# Patient Record
Sex: Female | Born: 2000
Health system: Southern US, Community
[De-identification: ages and names within clinical notes are randomized; demographics above are authoritative.]

## PROBLEM LIST (undated history)

## (undated) DIAGNOSIS — J45909 Unspecified asthma, uncomplicated: Secondary | ICD-10-CM

## (undated) DIAGNOSIS — K219 Gastro-esophageal reflux disease without esophagitis: Secondary | ICD-10-CM

## (undated) DIAGNOSIS — J101 Influenza due to other identified influenza virus with other respiratory manifestations: Secondary | ICD-10-CM

## (undated) DIAGNOSIS — E282 Polycystic ovarian syndrome: Secondary | ICD-10-CM

## (undated) HISTORY — DX: Gastro-esophageal reflux disease without esophagitis: K21.9

## (undated) HISTORY — PX: TYMPANOSTOMY: SHX2586

---
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
## 2021-03-01 IMAGING — CT CT ABD-PELV W/ CM
2 of 4 series · 15 of 46 positions shown, 17 images · IV contrast (APPLIED)
Comparison: CT abdomen pelvis dated 07/12/2019.

CLINICAL DATA: 19-year-old female 19-year-old female with right
lower quadrant abdominal pain.

EXAM:
CT ABDOMEN AND PELVIS WITH CONTRAST
TECHNIQUE: Multidetector CT imaging of the abdomen and pelvis was performed
using the standard protocol following bolus administration of
intravenous contrast.
CONTRAST:  100mL OMNIPAQUE IOHEXOL 300 MG/ML  SOLN

[Series 2: axial st · axial · 0.68mm/px · z∈[+1018,+1433]mm · 12 of 95 slices shown, 14 images]
[im 6/95  soft-tissue]
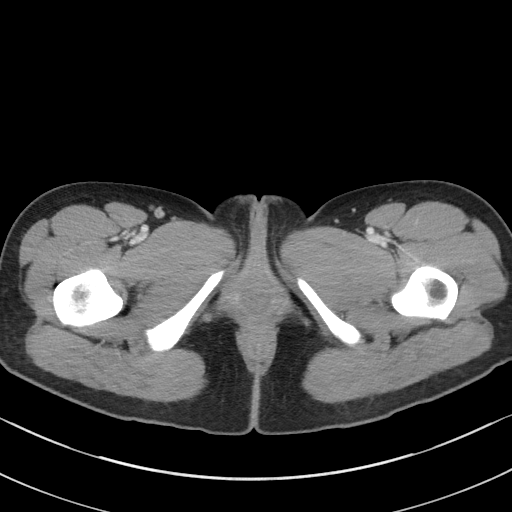
[im 6/95  bone]
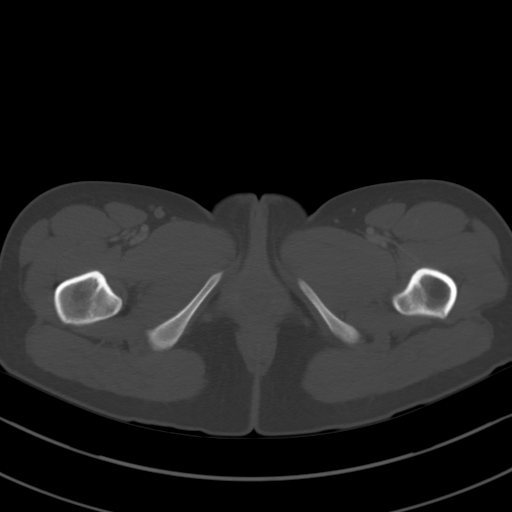
[im 16/95  soft-tissue]
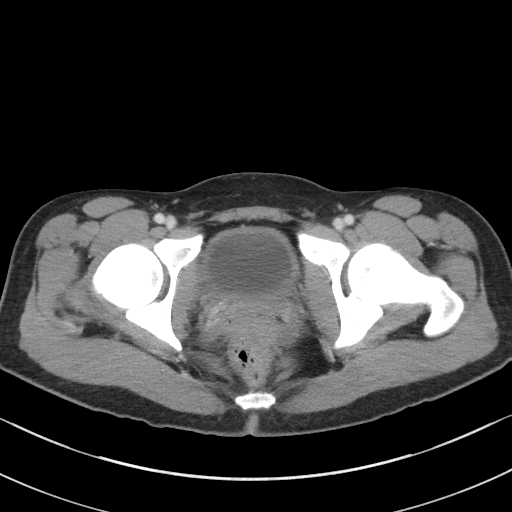
[im 21/95  soft-tissue]
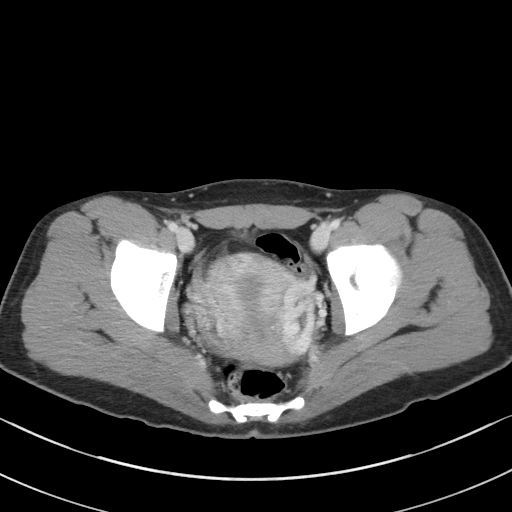
[im 27/95  soft-tissue]
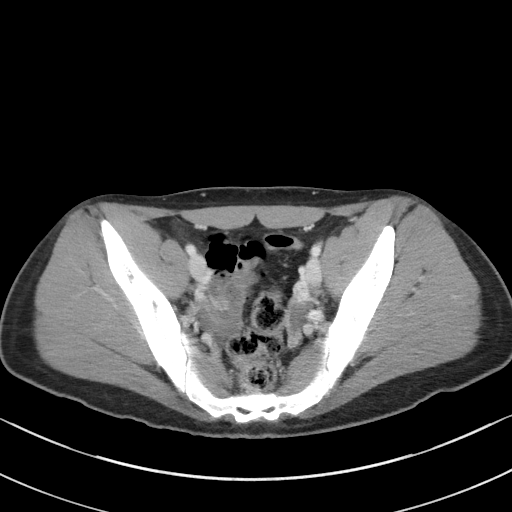
[im 37/95  soft-tissue]
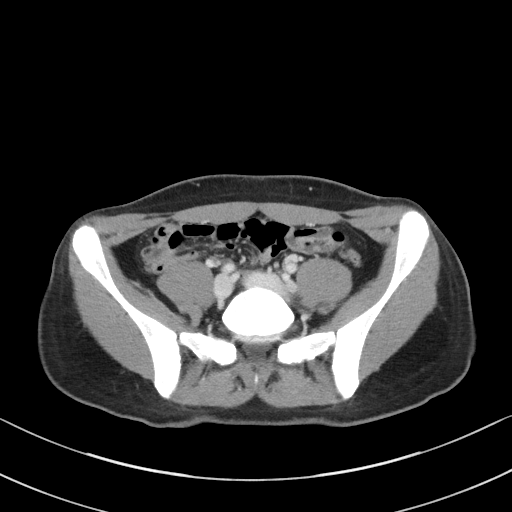
[im 42/95  soft-tissue]
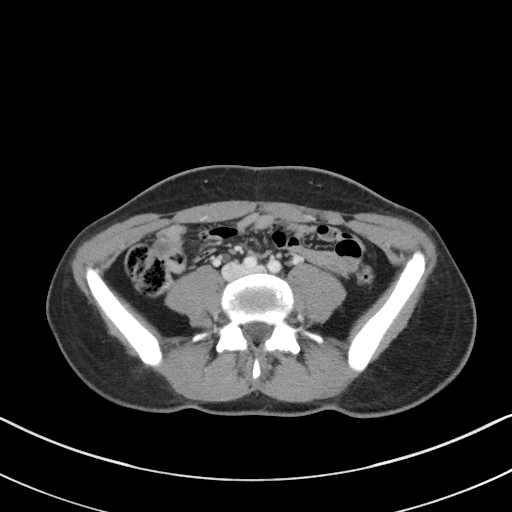
[im 53/95  soft-tissue]
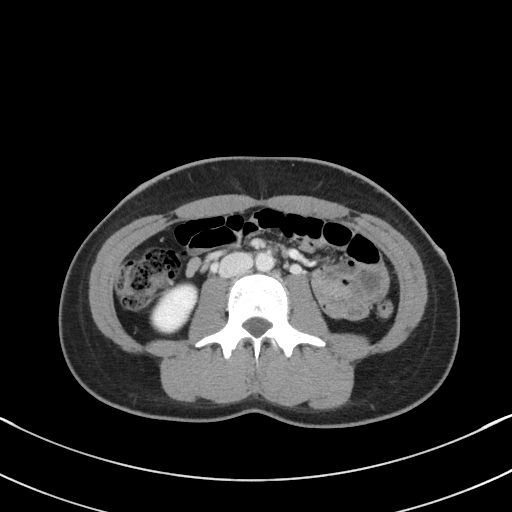
[im 58/95  soft-tissue]
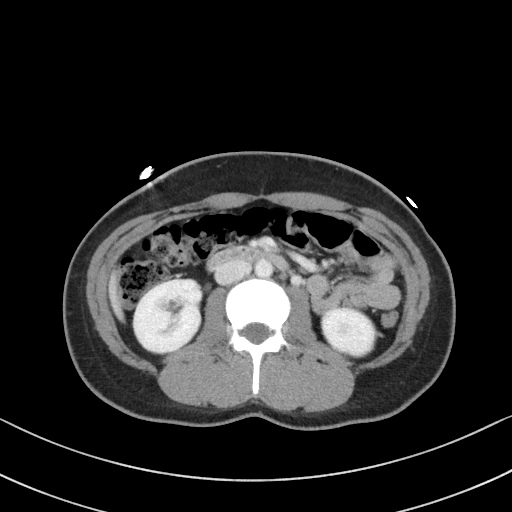
[im 68/95  soft-tissue]
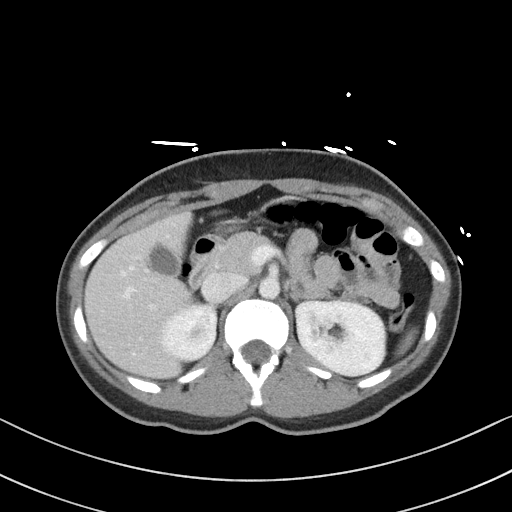
[im 68/95  bone]
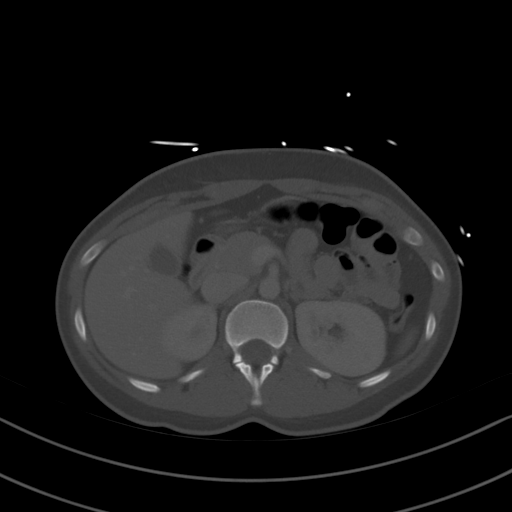
[im 74/95  soft-tissue]
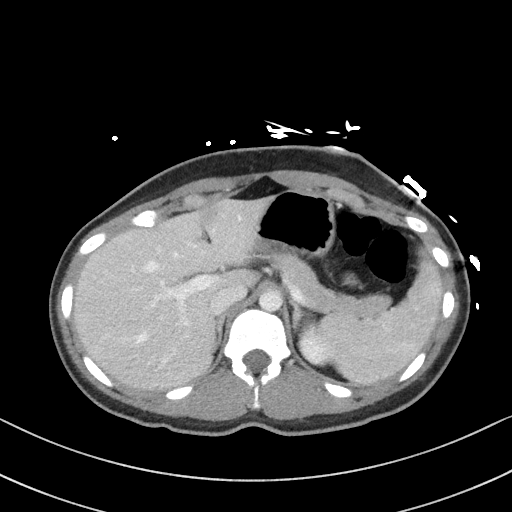
[im 79/95  soft-tissue]
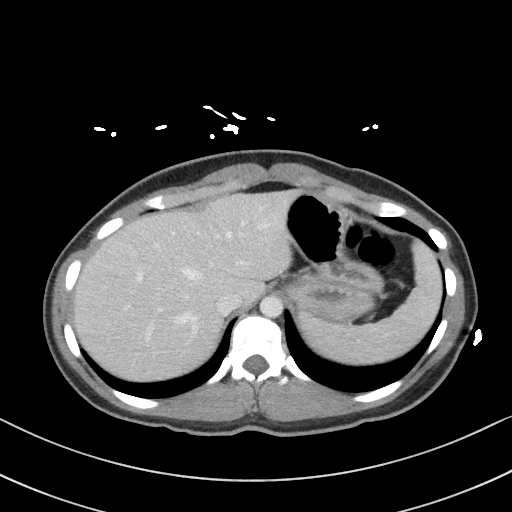
[im 89/95  soft-tissue]
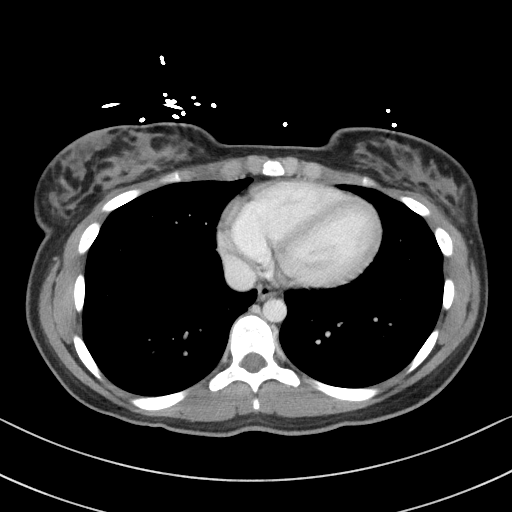

[Series 5: coronal st · coronal · 0.63mm/px · 3 of 70 slices shown]
[im 24/70  soft-tissue]
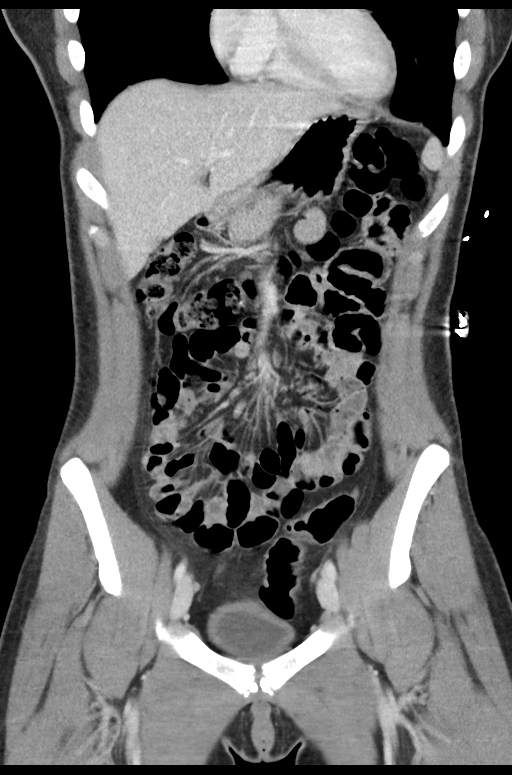
[im 31/70  soft-tissue]
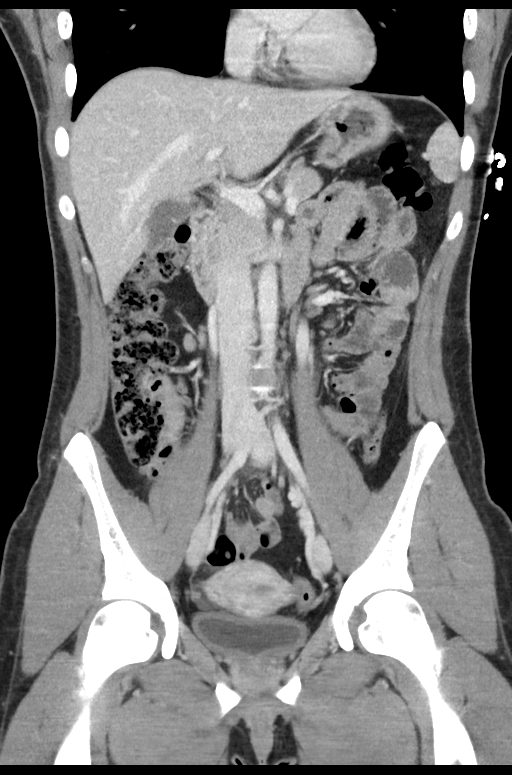
[im 39/70  soft-tissue]
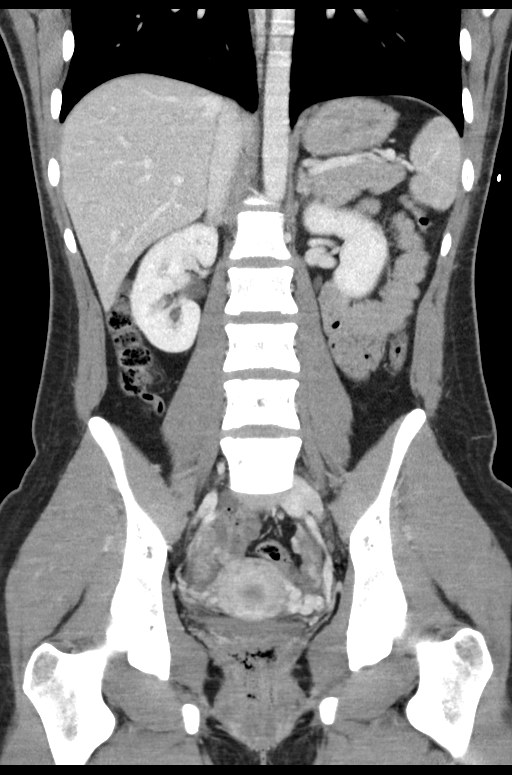

[15 of 46 positions shown; findings below may reference images not displayed]

FINDINGS: Lower chest: The visualized lung bases are clear.

No intra-abdominal free air. There is a small slightly higher
attenuating fluid within the pelvis, likely a serosanguineous or
hemorrhagic fluid.

Hepatobiliary: No focal liver abnormality is seen. No gallstones,
gallbladder wall thickening, or biliary dilatation.

Pancreas: Unremarkable. No pancreatic ductal dilatation or
surrounding inflammatory changes.

Spleen: Normal in size without focal abnormality.

Adrenals/Urinary Tract: Adrenal glands are unremarkable. Kidneys are
normal, without renal calculi, focal lesion, or hydronephrosis.
Bladder is unremarkable.

Stomach/Bowel: There is no bowel obstruction or active inflammation.
The appendix is normal.

Vascular/Lymphatic: The abdominal aorta and IVC unremarkable. No
portal venous gas. There is no adenopathy.

Reproductive: The uterus is anteverted and grossly unremarkable.
There is a 2 cm dominant follicle or corpus luteum in the right
ovary with possible rupture. The left ovary is unremarkable.

There is a somewhat linear contrast blush in the right hemipelvis
adjacent to the right ovary (bone series 5 images 32-49 and axial
series 2 images 70-72) likely a small venous bleed. There is slight
prominence of the gonadal veins within the pelvis.

Other: None

Musculoskeletal: No acute or significant osseous findings.
IMPRESSION: 1. Small amount of hemorrhagic fluid within the pelvis may be
related to rupture of a 2 cm right ovarian follicle or small venous
bleed.
2. No bowel obstruction. Normal appendix.

## 2021-03-21 ENCOUNTER — Encounter: Payer: Self-pay | Admitting: Neurology

## 2021-04-01 NOTE — Progress Notes (Signed)
[facility] Clinic Note - Initial Visit   Date: 04/04/21  Beverly Ward MRN: [id number] DOB: [birth year]   Dear Dr.Thompson:  Thank you for your kind referral of Beverly Ward for consultation of left hand numbness. Although her history is well known to you, please allow Beverly Ward to reiterate it for the purpose of our medical record. The patient was accompanied to the clinic by self.    History of Present Illness: Beverly Ward is a 21 y.o. right-handed female presenting for evaluation of left hand numbness. Starting around December, she began having spells of numbness/tingling of the left hand. It generally lasts about 2 hours and occurs 2-4 times times daily. She has noticed that it occurs more when she is holding onto something. No specific triggers or alleviating factors. No neck pain or weakness. No face/leg numbness or similar symptoms on the right.   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  She lives at home with her mother.   Past Medical History:  Diagnosis Date   Asthma    GERD (gastroesophageal reflux disease)    Influenza B     Past Surgical History:  Procedure Laterality Date   TYMPANOSTOMY Bilateral      Medications:  Outpatient Encounter Medications as of 04/04/2021  Medication Sig Note   [DISCONTINUED] albuterol (VENTOLIN HFA) 108 (90 Base) MCG/ACT inhaler Inhale 2 puffs into the lungs every 4 (four) hours as needed for wheezing or shortness of breath.    [DISCONTINUED] amitriptyline (ELAVIL) 25 MG tablet Take 1 tablet (25 mg total) by mouth at bedtime.    [DISCONTINUED] ipratropium (ATROVENT) 0.06 % nasal spray Place 2 sprays into both nostrils 4 (four) times daily. (Patient not taking: Reported on 07/09/2019)    [DISCONTINUED] ketoconazole (NIZORAL) 2 % cream Apply 1 application topically 2 (two) times daily.  (Patient not taking: Reported on 04/04/2021)    [DISCONTINUED] loratadine (CLARITIN) 10 MG tablet Take  by mouth. 10/16/2016: PRN   [DISCONTINUED] norethindrone (AYGESTIN) 5 MG tablet Take 1 tablet (5 mg total) by mouth daily for 10 days. Take 1 pill daily for 10 days at the beginning of each month (Patient not taking: Reported on 04/04/2021)    [DISCONTINUED] omeprazole (PRILOSEC) 20 MG capsule Take 1 capsule (20 mg total) by mouth 2 (two) times daily before a meal.    [DISCONTINUED] QUDEXY XR 25 MG CS24 sprinkle cap Take 1 at bedtime for 1 month then stop the medication    [DISCONTINUED] SUMAtriptan (IMITREX) 25 MG tablet Take 1 tablet at onset of migraine along with Ibuprofen 400mg . (Patient not taking: Reported on 04/04/2021) 08/28/2019: Pt needs refill.   No facility-administered encounter medications on file as of 04/04/2021.    Allergies:  Allergies  Allergen Reactions   Codeine Swelling   Augmentin [Amoxicillin-Pot Clavulanate]     Does not remember   Penicillins     Does not remember   Hepatitis A Virus Vaccine Inactivated Rash    macular rash started 2 hours after Hep A Vaccine    Family History: Family History  Problem Relation Age of Onset   Migraines Mother    Hypertension Mother    Hypertension Father    Asthma Sister    Asthma Brother    Diabetes Maternal Grandmother    Hypertension Maternal Grandmother    Diabetes Maternal Grandfather    Hypertension Maternal Grandfather    Migraines Maternal Grandfather    Seizures Cousin  Strong pfhx    Social History: Social History   Tobacco Use   Smoking status: Never   Smokeless tobacco: Never  Vaping Use   Vaping Use: Never used  Substance Use Topics   Alcohol use: No   Drug use: No   Social History   Social History Narrative   Beverly Ward is a rising 10th grade student.   She attends [employer].    Lives with mother and siblings.       Right Handed    Lives in a two story home            Vital Signs:  BP 122/83    Pulse (!) 101    Ht 5\' 3"  (1.6 m)    Wt 121 lb (54.9 kg)    SpO2 100%    BMI  21.43 kg/m   Neurological Exam: MENTAL STATUS including orientation to time, place, person, recent and remote memory, attention span and concentration, language, and fund of knowledge is normal.  Speech is not dysarthric.  CRANIAL NERVES: II:  No visual field defects.     III-IV-VI: Pupils equal round and reactive to light.  Normal conjugate, extra-ocular eye movements in all directions of gaze.  No nystagmus.  No ptosis.   V:  Normal facial sensation.    VII:  Normal facial symmetry and movements.   VIII:  Normal hearing and vestibular function.   IX-X:  Normal palatal movement.   XI:  Normal shoulder shrug and head rotation.   XII:  Normal tongue strength and range of motion, no deviation or fasciculation.  MOTOR:  Motor strength is 5/5 throughout.  No atrophy, fasciculations or abnormal movements.  No pronator drift.  MSRs:  Right        Left                  brachioradialis 2+  2+  biceps 2+  2+  triceps 2+  2+  patellar 2+  2+  ankle jerk 2+  2+  Hoffman no  no  plantar response down  down   SENSORY:  Normal and symmetric perception of light touch, pinprick, vibration, and proprioception  COORDINATION/GAIT: Normal finger-to- nose-finger.  Intact rapid alternating movements bilaterally.  Gait narrow based and stable. Tandem and stressed gait intact.    IMPRESSION: Left hand numbness, episodic. Ddx: entrapment neuropathy vs cervical radiculopathy. Symptoms do not follow a particular nerve distribution and exam is normal.  Additional testing with NCS/EMG of the left hand will be ordered to better localize her symptoms.  Further recommendations pending results.   Thank you for allowing me to participate in patient's care.  If I can answer any additional questions, I would be pleased to do so.    Sincerely,    Aizen Duval K. Posey Pronto, DO

## 2021-04-04 ENCOUNTER — Other Ambulatory Visit: Payer: Self-pay

## 2021-04-04 ENCOUNTER — Encounter: Payer: Self-pay | Admitting: Neurology

## 2021-04-04 ENCOUNTER — Ambulatory Visit (INDEPENDENT_AMBULATORY_CARE_PROVIDER_SITE_OTHER): Payer: No Typology Code available for payment source | Admitting: Neurology

## 2021-04-04 VITALS — BP 122/83 | HR 101 | Ht 63.0 in | Wt 121.0 lb

## 2021-04-04 DIAGNOSIS — R202 Paresthesia of skin: Secondary | ICD-10-CM | POA: Diagnosis not present

## 2021-04-04 NOTE — Patient Instructions (Signed)
Nerve testing of the left arm  ELECTROMYOGRAM AND NERVE CONDUCTION STUDIES (EMG/NCS) INSTRUCTIONS  How to Prepare The neurologist conducting the EMG will need to know if you have certain medical conditions. Tell the neurologist and other EMG lab personnel if you: Have a pacemaker or any other electrical medical device Take blood-thinning medications Have hemophilia, a blood-clotting disorder that causes prolonged bleeding Bathing Take a shower or bath shortly before your exam in order to remove oils from your skin. Don't apply lotions or creams before the exam.  What to Expect You'll likely be asked to change into a hospital gown for the procedure and lie down on an examination table. The following explanations can help you understand what will happen during the exam.  Electrodes. The neurologist or a technician places surface electrodes at various locations on your skin depending on where you're experiencing symptoms. Or the neurologist may insert needle electrodes at different sites depending on your symptoms.  Sensations. The electrodes will at times transmit a tiny electrical current that you may feel as a twinge or spasm. The needle electrode may cause discomfort or pain that usually ends shortly after the needle is removed. If you are concerned about discomfort or pain, you may want to talk to the neurologist about taking a short break during the exam.  Instructions. During the needle EMG, the neurologist will assess whether there is any spontaneous electrical activity when the muscle is at rest - activity that isn't present in healthy muscle tissue - and the degree of activity when you slightly contract the muscle.  He or she will give you instructions on resting and contracting a muscle at appropriate times. Depending on what muscles and nerves the neurologist is examining, he or she may ask you to change positions during the exam.  After your EMG You may experience some temporary, minor  bruising where the needle electrode was inserted into your muscle. This bruising should fade within several days. If it persists, contact your primary care doctor.   

## 2021-04-07 ENCOUNTER — Ambulatory Visit (INDEPENDENT_AMBULATORY_CARE_PROVIDER_SITE_OTHER): Payer: No Typology Code available for payment source | Admitting: Neurology

## 2021-04-07 ENCOUNTER — Encounter: Payer: Self-pay | Admitting: Neurology

## 2021-04-07 ENCOUNTER — Other Ambulatory Visit: Payer: Self-pay

## 2021-04-07 DIAGNOSIS — R202 Paresthesia of skin: Secondary | ICD-10-CM

## 2021-04-07 DIAGNOSIS — G5622 Lesion of ulnar nerve, left upper limb: Secondary | ICD-10-CM

## 2021-04-07 NOTE — Procedures (Signed)
[facility]  [address] [place], [address]  [place], [place] [address] Tel: [phone] Fax:  [phone] Test Date:  04/07/2021  Patient: Beverly Ward DOB: Aug 06, 2000 Physician: Nita Sickle, DO  Sex: Female Height: 5\' 3"  Ref Phys: , DO  ID#: Nita Sickle   Technician:    Patient Complaints: This is a 21 year old female referred for evaluation of left hand paresthesias  NCV & EMG Findings: Extensive electrodiagnostic testing of the left upper extremity shows:  Left median, ulnar, and mixed palmar sensory responses are within normal limits Left median motor responses within normal limits.  Left ulnar motor response shows slowed conduction velocity across the elbow (A Elbow-B Elbow, 48 m/s).   There is no evidence of active or chronic motor axonal loss changes affecting any of the tested muscles.  Motor unit configuration and recruitment pattern is within normal limits.    Impression: Left ulnar neuropathy with slowing across the elbow, purely demyelinating, mild. There is no evidence of left carpal tunnel syndrome or cervical radiculopathy.   ___________________________ 12, DO    Nerve Conduction Studies Anti Sensory Summary Table   Stim Site NR Peak (ms) Norm Peak (ms) P-T Amp (V) Norm P-T Amp  Left Median Anti Sensory (2nd Digit)  Wrist    2.5 <3.3 52.1 >20  Left Ulnar Anti Sensory (5th Digit)  34C  Wrist    2.3 <3.0 58.1 >18   Motor Summary Table   Stim Site NR Onset (ms) Norm Onset (ms) O-P Amp (mV) Norm O-P Amp Site1 Site2 Delta-0 (ms) Dist (cm) Vel (m/s) Norm Vel (m/s)  Left Median Motor (Abd Poll Brev)  34C  Wrist    3.0 <3.9 8.4 >6 Elbow Wrist 4.3 27.0 63 >51  Elbow    7.3  8.2         Left Ulnar Motor (Abd Dig Minimi)  34C  Wrist    2.5 <3.0 12.7 >8 B Elbow Wrist 2.8 20.0 71 >51  B Elbow    5.3  12.2  A Elbow B Elbow 2.1 10.0 48 >51  A Elbow    7.4  11.7          Comparison Summary Table   Stim Site NR Peak (ms) Norm Peak  (ms) P-T Amp (V) Site1 Site2 Delta-P (ms) Norm Delta (ms)  Left Median/Ulnar Palm Comparison (Wrist - 8cm)  34C  Median Palm    1.4 <2.2 133.8 Median Palm Ulnar Palm 0.1   Ulnar Palm    1.3 <2.2 45.0       EMG   Side Muscle Ins Act Fibs Psw Fasc Number Recrt Dur Dur. Amp Amp. Poly Poly. Comment  Left 1stDorInt Nml Nml Nml Nml Nml Nml Nml Nml Nml Nml Nml Nml N/A  Left PronatorTeres Nml Nml Nml Nml Nml Nml Nml Nml Nml Nml Nml Nml N/A  Left Biceps Nml Nml Nml Nml Nml Nml Nml Nml Nml Nml Nml Nml N/A  Left Triceps Nml Nml Nml Nml Nml Nml Nml Nml Nml Nml Nml Nml N/A  Left FlexCarpiUln Nml Nml Nml Nml Nml Nml Nml Nml Nml Nml Nml Nml N/A  Left Deltoid Nml Nml Nml Nml Nml Nml Nml Nml Nml Nml Nml Nml N/A      Waveforms:

## 2021-04-27 DIAGNOSIS — Z9049 Acquired absence of other specified parts of digestive tract: Secondary | ICD-10-CM | POA: Diagnosis not present

## 2021-04-27 DIAGNOSIS — R109 Unspecified abdominal pain: Secondary | ICD-10-CM | POA: Diagnosis not present

## 2021-04-27 DIAGNOSIS — S0990XA Unspecified injury of head, initial encounter: Secondary | ICD-10-CM | POA: Diagnosis not present

## 2021-04-27 DIAGNOSIS — S199XXA Unspecified injury of neck, initial encounter: Secondary | ICD-10-CM | POA: Diagnosis not present

## 2021-04-27 DIAGNOSIS — R1084 Generalized abdominal pain: Secondary | ICD-10-CM | POA: Diagnosis not present

## 2021-04-27 DIAGNOSIS — R079 Chest pain, unspecified: Secondary | ICD-10-CM | POA: Diagnosis not present

## 2021-04-27 DIAGNOSIS — R000 Tachycardia, unspecified: Secondary | ICD-10-CM | POA: Diagnosis not present

## 2021-05-23 ENCOUNTER — Ambulatory Visit: Payer: BC Managed Care – PPO | Admitting: Neurology

## 2021-06-25 ENCOUNTER — Encounter (HOSPITAL_COMMUNITY): Payer: Self-pay

## 2021-06-25 ENCOUNTER — Emergency Department (HOSPITAL_COMMUNITY)
Admission: EM | Admit: 2021-06-25 | Discharge: 2021-06-25 | Disposition: A | Discharge status: Home / Self Care | Payer: BC Managed Care – PPO | Attending: Emergency Medicine | Admitting: Emergency Medicine

## 2021-06-25 ENCOUNTER — Other Ambulatory Visit: Payer: Self-pay

## 2021-06-25 DIAGNOSIS — K2900 Acute gastritis without bleeding: Secondary | ICD-10-CM | POA: Diagnosis not present

## 2021-06-25 DIAGNOSIS — R000 Tachycardia, unspecified: Secondary | ICD-10-CM | POA: Diagnosis not present

## 2021-06-25 DIAGNOSIS — R8271 Bacteriuria: Secondary | ICD-10-CM | POA: Diagnosis not present

## 2021-06-25 DIAGNOSIS — R1084 Generalized abdominal pain: Secondary | ICD-10-CM | POA: Diagnosis not present

## 2021-06-25 DIAGNOSIS — R112 Nausea with vomiting, unspecified: Secondary | ICD-10-CM

## 2021-06-25 HISTORY — DX: Polycystic ovarian syndrome: E28.2

## 2021-06-25 LAB — URINALYSIS, ROUTINE W REFLEX MICROSCOPIC
Bilirubin Urine: NEGATIVE
Glucose, UA: NEGATIVE mg/dL
Hgb urine dipstick: NEGATIVE
Ketones, ur: NEGATIVE mg/dL
Nitrite: POSITIVE — AB
Protein, ur: NEGATIVE mg/dL
Specific Gravity, Urine: 1.012 (ref 1.005–1.030)
pH: 7 (ref 5.0–8.0)

## 2021-06-25 LAB — CBC WITH DIFFERENTIAL/PLATELET
Abs Immature Granulocytes: 0.05 10*3/uL (ref 0.00–0.07)
Basophils Absolute: 0.1 10*3/uL (ref 0.0–0.1)
Basophils Relative: 1 %
Eosinophils Absolute: 0.1 10*3/uL (ref 0.0–0.5)
Eosinophils Relative: 1 %
HCT: 40.1 % (ref 36.0–46.0)
Hemoglobin: 14.2 g/dL (ref 12.0–15.0)
Immature Granulocytes: 0 %
Lymphocytes Relative: 20 %
Lymphs Abs: 2.3 10*3/uL (ref 0.7–4.0)
MCH: 32.9 pg (ref 26.0–34.0)
MCHC: 35.4 g/dL (ref 30.0–36.0)
MCV: 92.8 fL (ref 80.0–100.0)
Monocytes Absolute: 1.2 10*3/uL — ABNORMAL HIGH (ref 0.1–1.0)
Monocytes Relative: 10 %
Neutro Abs: 8.3 10*3/uL — ABNORMAL HIGH (ref 1.7–7.7)
Neutrophils Relative %: 68 %
Platelets: 298 10*3/uL (ref 150–400)
RBC: 4.32 MIL/uL (ref 3.87–5.11)
RDW: 12 % (ref 11.5–15.5)
WBC: 12 10*3/uL — ABNORMAL HIGH (ref 4.0–10.5)
nRBC: 0 % (ref 0.0–0.2)

## 2021-06-25 LAB — HCG, SERUM, QUALITATIVE: Preg, Serum: NEGATIVE

## 2021-06-25 LAB — COMPREHENSIVE METABOLIC PANEL
ALT: 18 U/L (ref 0–44)
AST: 18 U/L (ref 15–41)
Albumin: 4.2 g/dL (ref 3.5–5.0)
Alkaline Phosphatase: 48 U/L (ref 38–126)
Anion gap: 6 (ref 5–15)
BUN: 13 mg/dL (ref 6–20)
CO2: 25 mmol/L (ref 22–32)
Calcium: 8.7 mg/dL — ABNORMAL LOW (ref 8.9–10.3)
Chloride: 107 mmol/L (ref 98–111)
Creatinine, Ser: 0.73 mg/dL (ref 0.44–1.00)
GFR, Estimated: >60 mL/min (ref >60)
Glucose, Bld: 104 mg/dL — ABNORMAL HIGH (ref 70–99)
Potassium: 3.5 mmol/L (ref 3.5–5.1)
Sodium: 138 mmol/L (ref 135–145)
Total Bilirubin: 0.4 mg/dL (ref 0.3–1.2)
Total Protein: 7.2 g/dL (ref 6.5–8.1)

## 2021-06-25 LAB — LIPASE, BLOOD: Lipase: 31 U/L (ref 11–51)

## 2021-06-25 MED ORDER — PANTOPRAZOLE SODIUM 20 MG PO TBEC: 20.0000 mg | DELAYED_RELEASE_TABLET | Freq: Every day | ORAL | 0 refills | Status: DC

## 2021-06-25 MED ORDER — ALUM & MAG HYDROXIDE-SIMETH 200-200-20 MG/5ML PO SUSP
30.0000 mL | Freq: Once | ORAL | Status: AC
  Administered 2021-06-25: 30 mL via ORAL
  Filled 2021-06-25: qty 30

## 2021-06-25 MED ORDER — LIDOCAINE VISCOUS HCL 2 % MT SOLN
15.0000 mL | Freq: Once | OROMUCOSAL | Status: AC
  Administered 2021-06-25: 15 mL via ORAL
  Filled 2021-06-25: qty 15

## 2021-06-25 MED ORDER — ONDANSETRON HCL 4 MG/2ML IJ SOLN
4.0000 mg | Freq: Once | INTRAMUSCULAR | Status: AC
Start: 2021-06-25 — End: 2021-06-25
  Administered 2021-06-25: 4 mg via INTRAVENOUS
  Filled 2021-06-25: qty 2

## 2021-06-25 MED ORDER — LACTATED RINGERS IV BOLUS
1000.0000 mL | Freq: Once | INTRAVENOUS | Status: AC
  Administered 2021-06-25: 1000 mL via INTRAVENOUS

## 2021-06-25 MED ORDER — PANTOPRAZOLE SODIUM 40 MG IV SOLR
40.0000 mg | Freq: Once | INTRAVENOUS | Status: AC
Start: 2021-06-25 — End: 2021-06-25
  Administered 2021-06-25: 40 mg via INTRAVENOUS
  Filled 2021-06-25: qty 10

## 2021-06-25 MED ORDER — CEFPODOXIME PROXETIL 200 MG PO TABS: 200.0000 mg | ORAL_TABLET | Freq: Two times a day (BID) | ORAL | 0 refills | Status: AC

## 2021-06-25 MED ORDER — SODIUM CHLORIDE 0.9 % IV SOLN
2.0000 g | Freq: Once | INTRAVENOUS | Status: AC
  Administered 2021-06-25: 2 g via INTRAVENOUS
  Filled 2021-06-25: qty 20

## 2021-06-25 MED ORDER — ONDANSETRON 4 MG PO TBDP: ORAL_TABLET | ORAL | 0 refills | Status: DC

## 2021-06-25 NOTE — ED Triage Notes (Signed)
Pt brought in by EMS form home with c/o LUQ abd pain, N/V that started a couple of hours ago. Pt says she vomited 3 times. ?

## 2021-06-25 NOTE — ED Provider Notes (Signed)
?[facility] ?Provider Note ? ? ?CSN: [id number] ?Arrival date & time: 06/25/21  0212 ? ?  ? ?History ? ?Chief Complaint  ?Patient presents with  ? Abdominal Pain  ?  N/V  ? ? ?Beverly Ward is a 21 y.o. female. ? ? ?Abdominal Pain ?Pain location:  Generalized ?Pain radiates to:  Does not radiate ?Pain severity:  Mild ?Onset quality:  Gradual ?Timing:  Constant ? ?  ? ?Home Medications ?Prior to Admission medications   ?Medication Sig Start Date End Date Taking? Authorizing Provider  ?cefpodoxime (VANTIN) 200 MG tablet Take 1 tablet (200 mg total) by mouth 2 (two) times daily for 5 days. 06/25/21 06/30/21 Yes Fenix Ruppe, Barbara Cower, MD  ?cyclobenzaprine (FLEXERIL) 10 MG tablet Take by mouth. 04/27/21  Yes [provider]  ?ondansetron (ZOFRAN-ODT) 4 MG disintegrating tablet 4mg  ODT q4 hours prn nausea/vomit 06/25/21  Yes Kimberlynn Lumbra, 06/27/21, MD  ?pantoprazole (PROTONIX) 20 MG tablet Take 1 tablet (20 mg total) by mouth daily. 06/25/21  Yes Docie Abramovich, 06/27/21, MD  ?albuterol (VENTOLIN HFA) 108 (90 Base) MCG/ACT inhaler Inhale 2 puffs into the lungs every 4 (four) hours as needed for wheezing or shortness of breath. 02/03/19 08/13/19  10/13/19, MD  ?amitriptyline (ELAVIL) 25 MG tablet Take 1 tablet (25 mg total) by mouth at bedtime. 08/22/16 02/03/19  02/05/19, NP  ?ipratropium (ATROVENT) 0.06 % nasal spray Place 2 sprays into both nostrils 4 (four) times daily. ?Patient not taking: Reported on 07/09/2019 10/07/17 08/13/19  10/13/19 B, NP  ?loratadine (CLARITIN) 10 MG tablet Take by mouth.  02/03/19  [provider]  ?omeprazole (PRILOSEC) 20 MG capsule Take 1 capsule (20 mg total) by mouth 2 (two) times daily before a meal. 07/12/19 08/13/19  Garrick Midgley, 10/13/19, MD  ?QUDEXY XR 25 MG CS24 sprinkle cap Take 1 at bedtime for 1 month then stop the medication 10/16/16 02/03/19  02/05/19, NP  ?   ? ?Allergies    ?Codeine, Augmentin [amoxicillin-pot clavulanate], Penicillins, and Hepatitis a virus  vaccine inactivated   ? ?Review of Systems   ?Review of Systems  ?Gastrointestinal:  Positive for abdominal pain.  ? ?Physical Exam ?Updated Vital Signs ?BP 137/88   Pulse (!) 114   Temp 98.2 ?F (36.8 ?C) (Oral)   Resp 16   Ht 5\' 4"  (1.626 m)   Wt 58.5 kg   SpO2 100%   BMI 22.14 kg/m?  ?Physical Exam ?Vitals and nursing note reviewed.  ?Constitutional:   ?   Appearance: She is well-developed.  ?HENT:  ?   Head: Normocephalic and atraumatic.  ?Cardiovascular:  ?   Rate and Rhythm: Regular rhythm. Tachycardia present.  ?Pulmonary:  ?   Effort: Pulmonary effort is normal. No respiratory distress.  ?   Breath sounds: No stridor.  ?Abdominal:  ?   General: Bowel sounds are normal. There is no distension.  ?   Palpations: Abdomen is soft.  ?   Tenderness: There is no abdominal tenderness.  ?Musculoskeletal:  ?   Cervical back: Normal range of motion.  ?Skin: ?   General: Skin is warm and dry.  ?Neurological:  ?   General: No focal deficit present.  ?   Mental Status: She is alert.  ? ? ?ED Results / Procedures / Treatments   ?Labs ?(all labs ordered are listed, but only abnormal results are displayed) ?Labs Reviewed  ?CBC WITH DIFFERENTIAL/PLATELET - Abnormal; Notable for the following components:  ?    Result Value  ?  WBC 12.0 (*)   ? Neutro Abs 8.3 (*)   ? Monocytes Absolute 1.2 (*)   ? All other components within normal limits  ?COMPREHENSIVE METABOLIC PANEL - Abnormal; Notable for the following components:  ? Glucose, Bld 104 (*)   ? Calcium 8.7 (*)   ? All other components within normal limits  ?URINALYSIS, ROUTINE W REFLEX MICROSCOPIC - Abnormal; Notable for the following components:  ? APPearance CLOUDY (*)   ? Nitrite POSITIVE (*)   ? Leukocytes,Ua SMALL (*)   ? Bacteria, UA MANY (*)   ? All other components within normal limits  ?URINE CULTURE  ?LIPASE, BLOOD  ?HCG, SERUM, QUALITATIVE  ? ? ?EKG ?None ? ?Radiology ?No results found. ? ?Procedures ?Procedures  ? ? ?Medications Ordered in ED ?Medications   ?ondansetron (ZOFRAN) injection 4 mg (4 mg Intravenous Given 06/25/21 0304)  ?lactated ringers bolus 1,000 mL (0 mLs Intravenous Stopped 06/25/21 0401)  ?alum & mag hydroxide-simeth (MAALOX/MYLANTA) 200-200-20 MG/5ML suspension 30 mL (30 mLs Oral Given 06/25/21 0306)  ?  And  ?lidocaine (XYLOCAINE) 2 % viscous mouth solution 15 mL (15 mLs Oral Given 06/25/21 0306)  ?pantoprazole (PROTONIX) injection 40 mg (40 mg Intravenous Given 06/25/21 0304)  ?cefTRIAXone (ROCEPHIN) 2 g in sodium chloride 0.9 % 100 mL IVPB (0 g Intravenous Stopped 06/25/21 0534)  ? ? ?ED Course/ Medical Decision Making/ A&P ?  ?                        ?Medical Decision Making ?Amount and/or Complexity of Data Reviewed ?Labs: ordered. ? ?Risk ?OTC drugs. ?Prescription drug management. ? ? ?Suspect gastritis/esophagitis. Treated for same. Also found to have likely UTI. Been treated for same multiple times in the past, however does wish to get treated again. Will FU w/ urology.  ? ? ?Final Clinical Impression(s) / ED Diagnoses ?Final diagnoses:  ?Nausea and vomiting, unspecified vomiting type  ?Acute gastritis without hemorrhage, unspecified gastritis type  ?Bacteriuria  ? ? ?Rx / DC Orders ?ED Discharge Orders   ? ?      Ordered  ?  ondansetron (ZOFRAN-ODT) 4 MG disintegrating tablet       ? 06/25/21 0520  ?  pantoprazole (PROTONIX) 20 MG tablet  Daily       ? 06/25/21 0520  ?  cefpodoxime (VANTIN) 200 MG tablet  2 times daily       ? 06/25/21 0520  ? ?  ?  ? ?  ? ? ?  ?Marily Memos, MD ?06/27/21 3716 ? ?

## 2021-06-28 LAB — URINE CULTURE: Culture: >=100000 — AB

## 2021-06-29 ENCOUNTER — Telehealth: Payer: Self-pay

## 2021-06-29 NOTE — Telephone Encounter (Signed)
Post ED Visit - Positive Culture Follow-up ? ?Culture report reviewed by antimicrobial stewardship pharmacist: ?[facility] ?[]  , Enzo Bi.D. ?[]  [address], Pharm.D., BCPS AQ-ID ?[]  , Pharm.D., BCPS ?[]  Celedonio Miyamoto, .D., BCPS ?[]  [place], .D., BCPS, AAHIVP ?[]  Georgina Pillion, Pharm.D., BCPS, AAHIVP ?[x]  [address], PharmD, BCPS ?[]  , PharmD, BCPS ?[]  [place], PharmD, BCPS ?[]  [address], PharmD ?[]  , PharmD, BCPS ?[]  Estella Husk, PharmD ? ? [facility] ?[]  Lysle Pearl, PharmD ?[]  , PharmD ?[]  Phillips Climes, PharmD ?[]  , Rph ?[]  Agapito Games) , PharmD ?[]  Verlan Friends, PharmD ?[]  , PharmD ?[]  Mervyn Gay, PharmD ?[]  , PharmD ?[]  Vinnie Level, PharmD ?[]  Gerri Spore, PharmD ?[]  , PharmD ?[]  Len Childs, PharmD ? ? ?Positive urine culture ?Treated with Cefpodoxime Proxetil, organism sensitive to the same and no further patient follow-up is required at this time. ? ? ?06/29/2021, 9:56 AM ?  ?

## 2021-07-25 ENCOUNTER — Emergency Department (HOSPITAL_COMMUNITY): Payer: BC Managed Care – PPO

## 2021-07-25 ENCOUNTER — Telehealth: Payer: Self-pay | Admitting: Neurology

## 2021-07-25 ENCOUNTER — Other Ambulatory Visit: Payer: Self-pay

## 2021-07-25 ENCOUNTER — Emergency Department (HOSPITAL_COMMUNITY)
Admission: EM | Admit: 2021-07-25 | Discharge: 2021-07-26 | Disposition: A | Discharge status: Home / Self Care | Payer: BC Managed Care – PPO | Attending: Emergency Medicine | Admitting: Emergency Medicine

## 2021-07-25 ENCOUNTER — Encounter (HOSPITAL_COMMUNITY): Payer: Self-pay

## 2021-07-25 DIAGNOSIS — R202 Paresthesia of skin: Secondary | ICD-10-CM | POA: Diagnosis not present

## 2021-07-25 DIAGNOSIS — D72829 Elevated white blood cell count, unspecified: Secondary | ICD-10-CM | POA: Diagnosis not present

## 2021-07-25 DIAGNOSIS — I619 Nontraumatic intracerebral hemorrhage, unspecified: Secondary | ICD-10-CM | POA: Diagnosis not present

## 2021-07-25 DIAGNOSIS — G1220 Motor neuron disease, unspecified: Secondary | ICD-10-CM | POA: Diagnosis not present

## 2021-07-25 DIAGNOSIS — J45909 Unspecified asthma, uncomplicated: Secondary | ICD-10-CM | POA: Insufficient documentation

## 2021-07-25 LAB — CBC WITH DIFFERENTIAL/PLATELET
Abs Immature Granulocytes: 0.06 10*3/uL (ref 0.00–0.07)
Basophils Absolute: 0.1 10*3/uL (ref 0.0–0.1)
Basophils Relative: 0 %
Eosinophils Absolute: 0 10*3/uL (ref 0.0–0.5)
Eosinophils Relative: 0 %
HCT: 43.1 % (ref 36.0–46.0)
Hemoglobin: 14.3 g/dL (ref 12.0–15.0)
Immature Granulocytes: 0 %
Lymphocytes Relative: 15 %
Lymphs Abs: 2.2 10*3/uL (ref 0.7–4.0)
MCH: 31.7 pg (ref 26.0–34.0)
MCHC: 33.2 g/dL (ref 30.0–36.0)
MCV: 95.6 fL (ref 80.0–100.0)
Monocytes Absolute: 0.8 10*3/uL (ref 0.1–1.0)
Monocytes Relative: 6 %
Neutro Abs: 11.1 10*3/uL — ABNORMAL HIGH (ref 1.7–7.7)
Neutrophils Relative %: 79 %
Platelets: 288 10*3/uL (ref 150–400)
RBC: 4.51 MIL/uL (ref 3.87–5.11)
RDW: 11.9 % (ref 11.5–15.5)
WBC: 14.3 10*3/uL — ABNORMAL HIGH (ref 4.0–10.5)
nRBC: 0 % (ref 0.0–0.2)

## 2021-07-25 LAB — COMPREHENSIVE METABOLIC PANEL
ALT: 12 U/L (ref 0–44)
AST: 18 U/L (ref 15–41)
Albumin: 4.4 g/dL (ref 3.5–5.0)
Alkaline Phosphatase: 45 U/L (ref 38–126)
Anion gap: 11 (ref 5–15)
BUN: 12 mg/dL (ref 6–20)
CO2: 21 mmol/L — ABNORMAL LOW (ref 22–32)
Calcium: 9 mg/dL (ref 8.9–10.3)
Chloride: 104 mmol/L (ref 98–111)
Creatinine, Ser: 0.72 mg/dL (ref 0.44–1.00)
GFR, Estimated: >60 mL/min (ref >60)
Glucose, Bld: 81 mg/dL (ref 70–99)
Potassium: 3.6 mmol/L (ref 3.5–5.1)
Sodium: 136 mmol/L (ref 135–145)
Total Bilirubin: 0.8 mg/dL (ref 0.3–1.2)
Total Protein: 7.2 g/dL (ref 6.5–8.1)

## 2021-07-25 LAB — I-STAT BETA HCG BLOOD, ED (MC, WL, AP ONLY): I-stat hCG, quantitative: <5 m[IU]/mL (ref <5)

## 2021-07-25 LAB — PHOSPHORUS: Phosphorus: 3.6 mg/dL (ref 2.5–4.6)

## 2021-07-25 LAB — MAGNESIUM: Magnesium: 2 mg/dL (ref 1.7–2.4)

## 2021-07-25 MED ORDER — GADOBUTROL 1 MMOL/ML IV SOLN
5.5000 mL | Freq: Once | INTRAVENOUS | Status: AC | PRN
  Administered 2021-07-25: 5.5 mL via INTRAVENOUS

## 2021-07-25 NOTE — ED Provider Triage Note (Signed)
Emergency Medicine Provider Triage Evaluation Note ? ?Beverly Ward , a 21 y.o. female  was evaluated in triage.  Pt complains of paresthesias to her right eye.  Symptoms have been going on for 3 days.  Cannot recall any incident that may have triggered this.  Denies any pain or symptoms in other parts of her body denies any sinus pain or pressure. ? ?Review of Systems  ?Positive: Facial paresthesias ?Negative: Pain ? ?Physical Exam  ?BP (!) 156/98   Pulse (!) 120   Temp 99.6 ?F (37.6 ?C) (Oral)   Resp 16   SpO2 100%  ?Gen:   Awake, no distress   ?Resp:  Normal effort  ?MSK:   Moves extremities without difficulty  ?Other:  No external abnormalities seen in this area ? ?Medical Decision Making  ?Medically screening exam initiated at 4:56 PM.  Appropriate orders placed.  Beverly Ward was informed that the remainder of the evaluation will be completed by another provider, this initial triage assessment does not replace that evaluation, and the importance of remaining in the ED until their evaluation is complete. ? ? ?  Dietrich Pates, PA-C ?07/25/21 1657 ? ?
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
## 2021-07-25 NOTE — ED Notes (Signed)
Pt off unit to MRI.

## 2021-07-25 NOTE — ED Triage Notes (Signed)
Patient complains of tingling of bridge of nose to tingling under right eye, denies trauma. Alert and oriented, NAD ?

## 2021-07-25 NOTE — ED Provider Notes (Signed)
[facility]   CSN: [id number] Arrival date & time: 07/25/21  1536     History  No chief complaint on file.   Beverly LinesCiera A Ward is a 21 y.o. female.  The history is provided by the patient and medical records. No language interpreter was used.   21 year old female significant history of generalized anxiety disorder, migraine, asthma, PCOS, who presents complaining of tingling sensation.  Patient report for the past 3 days she has been experiencing a tingling sensation to the bridge of her nose and right below right eye.  It has been a persistent symptoms nothing seems to make it better or worse.  No associated headache, vision changes, facial pain, congestion, fever neck stiffness or rash.  No hearing changes.  No recent injury.  No specific treatment tried.  Home Medications Prior to Admission medications   Medication Sig Start Date End Date Taking? Authorizing Provider  cyclobenzaprine (FLEXERIL) 10 MG tablet Take by mouth. 04/27/21   [provider]  ondansetron (ZOFRAN-ODT) 4 MG disintegrating tablet 4mg  ODT q4 hours prn nausea/vomit 06/25/21   Mesner, Barbara CowerJason, MD  pantoprazole (PROTONIX) 20 MG tablet Take 1 tablet (20 mg total) by mouth daily. 06/25/21   Mesner, Barbara CowerJason, MD  albuterol (VENTOLIN HFA) 108 (90 Base) MCG/ACT inhaler Inhale 2 puffs into the lungs every 4 (four) hours as needed for wheezing or shortness of breath. 02/03/19 08/13/19  Ree Shayeis, Jamie, MD  amitriptyline (ELAVIL) 25 MG tablet Take 1 tablet (25 mg total) by mouth at bedtime. 08/22/16 02/03/19  Elveria RisingGoodpasture, Tina, NP  ipratropium (ATROVENT) 0.06 % nasal spray Place 2 sprays into both nostrils 4 (four) times daily. Patient not taking: Reported on 07/09/2019 10/07/17 08/13/19  Linus MakoBurky, Natalie B, NP  loratadine (CLARITIN) 10 MG tablet Take by mouth.  02/03/19  [provider]  omeprazole (PRILOSEC) 20 MG capsule Take 1 capsule (20 mg total) by mouth 2 (two) times  daily before a meal. 07/12/19 08/13/19  Mesner, Barbara CowerJason, MD  QUDEXY XR 25 MG CS24 sprinkle cap Take 1 at bedtime for 1 month then stop the medication 10/16/16 02/03/19  Elveria RisingGoodpasture, Tina, NP      Allergies    Codeine, Augmentin [amoxicillin-pot clavulanate], Penicillins, and Hepatitis a virus vaccine inactivated    Review of Systems   Review of Systems  All other systems reviewed and are negative.  Physical Exam Updated Vital Signs BP (!) 156/98   Pulse (!) 120   Temp 99.6 F (37.6 C) (Oral)   Resp 16   SpO2 100%  Physical Exam Vitals and nursing note reviewed.  Constitutional:      General: She is not in acute distress.    Appearance: She is well-developed.  HENT:     Head: Atraumatic.     Right Ear: Tympanic membrane normal.     Left Ear: Tympanic membrane normal.     Mouth/Throat:     Mouth: Mucous membranes are moist.  Eyes:     Extraocular Movements: Extraocular movements intact.     Conjunctiva/sclera: Conjunctivae normal.     Pupils: Pupils are equal, round, and reactive to light.  Cardiovascular:     Rate and Rhythm: Normal rate and regular rhythm.  Pulmonary:     Effort: Pulmonary effort is normal.  Abdominal:     Palpations: Abdomen is soft.     Tenderness: There is no abdominal tenderness.  Musculoskeletal:     Cervical back: Neck supple.  Skin:    Findings: No  rash.  Neurological:     Mental Status: She is alert and oriented to person, place, and time.     Comments: Neurologic exam:  Speech clear, pupils equal round reactive to light, extraocular movements intact  Normal peripheral visual fields Cranial nerves III through XII normal including no facial droop Follows commands, moves all extremities x4, normal strength to bilateral upper and lower extremities at all major muscle groups including grip Sensation decreased to the bridge of nose and right zygomatic arch without any overlying skin changes.  Sensation is intact to bilateral arms and legs Coordination  intact, no limb ataxia, finger-nose-finger normal Rapid alternating movements normal No pronator drift Gait normal   Psychiatric:        Mood and Affect: Mood normal.    ED Results / Procedures / Treatments   Labs (all labs ordered are listed, but only abnormal results are displayed) Labs Reviewed  CBC WITH DIFFERENTIAL/PLATELET - Abnormal; Notable for the following components:      Result Value   WBC 14.3 (*)    Neutro Abs 11.1 (*)    All other components within normal limits  COMPREHENSIVE METABOLIC PANEL - Abnormal; Notable for the following components:   CO2 21 (*)    All other components within normal limits  MAGNESIUM  PHOSPHORUS  I-STAT BETA HCG BLOOD, ED ([facility], [facility], [facility] ONLY)    EKG None  Radiology MR Brain W and Wo Contrast  Result Date: 07/25/2021 CLINICAL DATA:  Motor neuron disease EXAM: MRI HEAD WITHOUT AND WITH CONTRAST TECHNIQUE: Multiplanar, multiecho pulse sequences of the brain and surrounding structures were obtained without and with intravenous contrast. CONTRAST:  5.7mL GADAVIST GADOBUTROL 1 MMOL/ML IV SOLN COMPARISON:  None Available. FINDINGS: Brain: No acute infarct, mass effect or extra-axial collection. No acute or chronic hemorrhage. Normal white matter signal, parenchymal volume and CSF spaces. The midline structures are normal. Vascular: Major flow voids are preserved. Skull and upper cervical spine: Normal calvarium and skull base. Visualized upper cervical spine and soft tissues are normal. Sinuses/Orbits:No paranasal sinus fluid levels or advanced mucosal thickening. No mastoid or middle ear effusion. Normal orbits. IMPRESSION: Normal brain MRI. Electronically Signed   By: Deatra Robinson M.D.   On: 07/25/2021 23:52    Procedures Procedures    Medications Ordered in ED Medications  gadobutrol (GADAVIST) 1 MMOL/ML injection 5.5 mL (5.5 mLs Intravenous Contrast Given 07/25/21 2316)    ED Course/ Medical Decision Making/ A&P                            Medical Decision Making Amount and/or Complexity of Data Reviewed Labs: ordered. Radiology: ordered.  Risk Prescription drug management.   BP 122/82   Pulse (!) 112   Temp 99.6 F (37.6 C) (Oral)   Resp 18   SpO2 100%   7:40 PM This is a 21 year old female who presents with complaints of tingling sensation to the bridge of her nose and underneath right eye ongoing for the past 3 days.  She denies any associated headache, congestion, facial pain, trouble breathing, blurry vision, change in vision or rash.  She denies any recent trauma.  She does have some subjective decrease sensation to the bridge of her nose and to her right zygomatic arch but no overlying skin changes.  Patient overall well-appearing appears to be in no acute discomfort.  No other focal neurodeficit appreciated.  Chart review shows patient has had several neurology visits by  [facility] for various complaints and was diagnosed with postconcussive syndromes as well as migraine with aura.  I appreciate consultation from on-call neurologist, Dr. Jerrell Belfast, who recommended brain MRI with and without contrast to rule out conditions such as MS.  7:42 PM  12:06 AM Labs and imaging obtained and independently reviewed and interpreted by me.  Mild elevated white count of 14.3, nonspecific but patient without infectious symptoms.  Her pregnancy test is negative, electrolyte panels are reassuring, an MRI of the brain with and without contrast without any acute finding.  Specifically no evidence of MS.  I discussed finding with patient, recommend outpatient follow-up with her neurologist for outpatient evaluation.   This patient presents to the ED for concern of tingling sensation, this involves an extensive number of treatment options, and is a complaint that carries with it a high risk of complications and morbidity.  The differential diagnosis includes MS, electrolytes imbalance, complicated migraine, herpes ophalmacus   Co  morbidities that complicate the patient evaluation hx of migraine  Anxiety  PTSD Additional history obtained:  Additional history obtained from pt External records from outside source obtained and reviewed including notes from her neurologist DR. Patel  Lab Tests:  I Ordered, and personally interpreted labs.  The pertinent results include:  as above  Imaging Studies ordered:  I ordered imaging studies including brain MRI I independently visualized and interpreted imaging which showed no acute finding I agree with the radiologist interpretation  Cardiac Monitoring:  The patient was maintained on a cardiac monitor.  I personally viewed and interpreted the cardiac monitored which showed an underlying rhythm of: sinus tachycardia  Medicines ordered and prescription drug management:   Test Considered: as above  Critical Interventions: none  Consultations Obtained:  I requested consultation with the neurologist DR. Aurora,  and discussed lab and imaging findings as well as pertinent plan - they recommend: brain MRI, if negative pt can f/u with her neurologist  Problem List / ED Course: facial tingling sensation  Reevaluation:  After the interventions noted above, I reevaluated the patient and found that they have :improved  Social Determinants of Health:   Dispostion:  After consideration of the diagnostic results and the patients response to treatment, I feel that the patent would benefit from outpt f/u.         Final Clinical Impression(s) / ED Diagnoses Final diagnoses:  Paresthesia    Rx / DC Orders ED Discharge Orders     None         Fayrene Helper, PA-C 07/26/21 0011    Gloris Manchester, MD 07/29/21 [phone]

## 2021-07-26 NOTE — Discharge Instructions (Signed)
You have been evaluated for your symptoms.  Fortunately your brain MRI did not show any concerning findings, specifically no evidence to suggest multiple sclerosis.  Please follow-up closely with your neurologist for further evaluation.  Return if your symptoms worsen or if you have any other concern. ?

## 2021-07-26 NOTE — ED Notes (Signed)
Patient verbalizes understanding of d/c instructions. Opportunities for questions and answers were provided. Pt d/c from ED and ambulated to lobby.  

## 2021-08-01 NOTE — Telephone Encounter (Signed)
Patient went to ER and had normal brain MRI. F/u when Dr. Posey Pronto returns, thanks

## 2021-08-01 NOTE — Telephone Encounter (Signed)
I called the patient and left a message for her to call back to schedule an appt with Dr. Posey Pronto

## 2024-04-11 ENCOUNTER — Encounter: Payer: Self-pay | Admitting: Family Medicine

## 2024-04-11 ENCOUNTER — Ambulatory Visit: Admitting: Family Medicine

## 2024-04-11 VITALS — BP 118/72 | HR 111 | Temp 98.2°F | Ht 64.0 in | Wt 116.6 lb

## 2024-04-11 DIAGNOSIS — Z7689 Persons encountering health services in other specified circumstances: Secondary | ICD-10-CM

## 2024-04-11 DIAGNOSIS — T148XXA Other injury of unspecified body region, initial encounter: Secondary | ICD-10-CM

## 2024-04-11 DIAGNOSIS — R591 Generalized enlarged lymph nodes: Secondary | ICD-10-CM

## 2024-04-11 DIAGNOSIS — S060X1A Concussion with loss of consciousness of 30 minutes or less, initial encounter: Secondary | ICD-10-CM | POA: Insufficient documentation

## 2024-04-11 DIAGNOSIS — N920 Excessive and frequent menstruation with regular cycle: Secondary | ICD-10-CM

## 2024-04-11 DIAGNOSIS — N1370 Vesicoureteral-reflux, unspecified: Secondary | ICD-10-CM | POA: Insufficient documentation

## 2024-04-11 DIAGNOSIS — R000 Tachycardia, unspecified: Secondary | ICD-10-CM

## 2024-04-11 DIAGNOSIS — R5383 Other fatigue: Secondary | ICD-10-CM

## 2024-04-11 DIAGNOSIS — R634 Abnormal weight loss: Secondary | ICD-10-CM

## 2024-04-11 DIAGNOSIS — N390 Urinary tract infection, site not specified: Secondary | ICD-10-CM | POA: Insufficient documentation

## 2024-04-11 DIAGNOSIS — K219 Gastro-esophageal reflux disease without esophagitis: Secondary | ICD-10-CM | POA: Insufficient documentation

## 2024-04-11 DIAGNOSIS — J029 Acute pharyngitis, unspecified: Secondary | ICD-10-CM

## 2024-04-11 LAB — CBC WITH DIFFERENTIAL/PLATELET
Basophils Absolute: 0 10*3/uL (ref 0.0–0.1)
Basophils Relative: 0.5 % (ref 0.0–3.0)
Eosinophils Absolute: 0.1 10*3/uL (ref 0.0–0.7)
Eosinophils Relative: 1.1 % (ref 0.0–5.0)
HCT: 40.9 % (ref 36.0–46.0)
Hemoglobin: 13.9 g/dL (ref 12.0–15.0)
Lymphocytes Relative: 19.7 % (ref 12.0–46.0)
Lymphs Abs: 1.2 10*3/uL (ref 0.7–4.0)
MCHC: 34 g/dL (ref 30.0–36.0)
MCV: 93.6 fl (ref 78.0–100.0)
Monocytes Absolute: 0.5 10*3/uL (ref 0.1–1.0)
Monocytes Relative: 8.8 % (ref 3.0–12.0)
Neutro Abs: 4.2 10*3/uL (ref 1.4–7.7)
Neutrophils Relative %: 69.9 % (ref 43.0–77.0)
Platelets: 258 10*3/uL (ref 150.0–400.0)
RBC: 4.37 Mil/uL (ref 3.87–5.11)
RDW: 13.9 % (ref 11.5–15.5)
WBC: 6 10*3/uL (ref 4.0–10.5)

## 2024-04-11 LAB — COMPREHENSIVE METABOLIC PANEL WITH GFR
ALT: 15 U/L (ref 3–35)
AST: 16 U/L (ref 5–37)
Albumin: 4.8 g/dL (ref 3.5–5.2)
Alkaline Phosphatase: 57 U/L (ref 39–117)
BUN: 8 mg/dL (ref 6–23)
CO2: 27 meq/L (ref 19–32)
Calcium: 9.3 mg/dL (ref 8.4–10.5)
Chloride: 102 meq/L (ref 96–112)
Creatinine, Ser: 0.79 mg/dL (ref 0.40–1.20)
GFR: 105.92 mL/min
Glucose, Bld: 87 mg/dL (ref 70–99)
Potassium: 3.7 meq/L (ref 3.5–5.1)
Sodium: 138 meq/L (ref 135–145)
Total Bilirubin: 1.1 mg/dL (ref 0.2–1.2)
Total Protein: 7.5 g/dL (ref 6.0–8.3)

## 2024-04-11 LAB — APTT: aPTT: 31.1 s (ref 25.4–36.8)

## 2024-04-11 LAB — PROTIME-INR
INR: 1.2 ratio — ABNORMAL HIGH (ref 0.8–1.0)
Prothrombin Time: 13.1 s (ref 9.6–13.1)

## 2024-04-11 LAB — IBC + FERRITIN
Ferritin: 117.9 ng/mL (ref 10.0–291.0)
Iron: 115 ug/dL (ref 42–145)
Saturation Ratios: 52.7 % — ABNORMAL HIGH (ref 20.0–50.0)
TIBC: 218.4 ug/dL — ABNORMAL LOW (ref 250.0–450.0)
Transferrin: 156 mg/dL — ABNORMAL LOW (ref 212.0–360.0)

## 2024-04-11 LAB — TSH: TSH: 1.74 u[IU]/mL (ref 0.35–5.50)

## 2024-04-11 LAB — B12 AND FOLATE PANEL
Folate: 9.9 ng/mL
Vitamin B-12: 299 pg/mL (ref 211–911)

## 2024-04-11 LAB — VITAMIN D 25 HYDROXY (VIT D DEFICIENCY, FRACTURES): VITD: 22.11 ng/mL — ABNORMAL LOW (ref 30.00–100.00)

## 2024-04-11 NOTE — Patient Instructions (Signed)
 We will call you with all results and discuss further plan and follow up based on those findings.         Great to see you today.  I have refilled the medication(s) we provide.   If labs were collected or images ordered, we will inform you of  results once we have received them and reviewed. We will contact you either by echart message, or telephone call.  Please give ample time to the testing facility, and our office to run,  receive and review results. Please do not call inquiring of results, even if you can see them in your chart. We will contact you as soon as we are able. If it has been over 1 week since the test was completed, and you have not yet heard from us , then please call us .    - echart message- for normal results that have been seen by the patient already.   - telephone call: abnormal results or if patient has not viewed results in their echart.  If a referral to a specialist was entered for you, please call us  in 2 weeks if you have not heard from the specialist office to schedule.

## 2024-04-15 ENCOUNTER — Ambulatory Visit: Payer: Self-pay | Admitting: Family Medicine

## 2024-04-15 ENCOUNTER — Encounter: Payer: Self-pay | Admitting: Family Medicine

## 2024-04-15 DIAGNOSIS — J029 Acute pharyngitis, unspecified: Secondary | ICD-10-CM | POA: Insufficient documentation

## 2024-04-15 DIAGNOSIS — R591 Generalized enlarged lymph nodes: Secondary | ICD-10-CM | POA: Insufficient documentation

## 2024-04-15 DIAGNOSIS — R000 Tachycardia, unspecified: Secondary | ICD-10-CM | POA: Insufficient documentation

## 2024-04-15 DIAGNOSIS — R5383 Other fatigue: Secondary | ICD-10-CM | POA: Insufficient documentation

## 2024-04-15 DIAGNOSIS — N920 Excessive and frequent menstruation with regular cycle: Secondary | ICD-10-CM | POA: Insufficient documentation

## 2024-04-15 DIAGNOSIS — R634 Abnormal weight loss: Secondary | ICD-10-CM | POA: Insufficient documentation

## 2024-04-15 DIAGNOSIS — T148XXA Other injury of unspecified body region, initial encounter: Secondary | ICD-10-CM | POA: Insufficient documentation

## 2024-04-15 MED ORDER — VITAMIN D (ERGOCALCIFEROL) 1.25 MG (50000 UNIT) PO CAPS: 50000.0000 [IU] | ORAL_CAPSULE | ORAL | 3 refills | Status: AC

## 2024-04-15 NOTE — Telephone Encounter (Signed)
 Please call patient to go over abnormal lab results Liver, kidney and thyroid function is normal. Blood counts and electrolytes are normal. PT/PTT-bleeding times are normal.   Vitamin D  is low at 22.11> I have called in a high-dose once weekly vitamin D  supplement for her to start today-make sure she understands this is once weekly and best absorbed with food if able. Vitamin B12 is low at 299> recommend she start an over-the-counter B12 1000 mcg daily.  This can be purchased at a drugstore or on [employer], I recommend she get the sublingual solution which is placed under the tongue for best absorption.  Iron levels in the blood are normal, with elevated iron saturations-if she is taking any supplement I would encourage her to discontinue for now until she sees us .  Will discuss this further and in more detail at her follow-up.  2 additional lab tests are still pending. Her ultrasound is scheduled for 2/5   Please schedule patient for follow-up appointment 10 days so we can review everything, including her ultrasound results and discuss next step.

## 2024-04-17 ENCOUNTER — Inpatient Hospital Stay: Admission: RE | Admit: 2024-04-17 | Discharge: 2024-04-17 | Attending: Family Medicine | Admitting: Family Medicine

## 2024-04-17 DIAGNOSIS — R591 Generalized enlarged lymph nodes: Secondary | ICD-10-CM

## 2024-04-17 DIAGNOSIS — J029 Acute pharyngitis, unspecified: Secondary | ICD-10-CM

## 2024-04-17 DIAGNOSIS — R5383 Other fatigue: Secondary | ICD-10-CM

## 2024-04-17 DIAGNOSIS — R634 Abnormal weight loss: Secondary | ICD-10-CM

## 2024-04-17 LAB — VON WILLEBRAND PANEL
Factor-VIII Activity: 103 %{normal} (ref 50–180)
Ristocetin Co-Factor: 106 %{normal} (ref 42–200)
Von Willebrand Antigen, Plasma: 125 % (ref 50–217)
aPTT: 30 s (ref 23–32)

## 2024-04-17 LAB — B. BURGDORFI ANTIBODIES: B burgdorferi Ab IgG+IgM: <0.9 {index}

## 2024-04-29 ENCOUNTER — Ambulatory Visit: Admitting: Family Medicine
